# Patient Record
Sex: Male | Born: 1995 | Race: White | Hispanic: No | Marital: Single | Smoking: Current every day smoker
Health system: Southern US, Community
[De-identification: ages and names within clinical notes are randomized; demographics above are authoritative.]

---
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
## 2013-01-18 ENCOUNTER — Encounter (HOSPITAL_BASED_OUTPATIENT_CLINIC_OR_DEPARTMENT_OTHER): Payer: Self-pay | Admitting: *Deleted

## 2013-01-18 ENCOUNTER — Emergency Department (HOSPITAL_BASED_OUTPATIENT_CLINIC_OR_DEPARTMENT_OTHER)
Admission: EM | Admit: 2013-01-18 | Discharge: 2013-01-18 | Disposition: A | Discharge status: Home / Self Care | Payer: Self-pay | Attending: Emergency Medicine | Admitting: Emergency Medicine

## 2013-01-18 DIAGNOSIS — F17200 Nicotine dependence, unspecified, uncomplicated: Secondary | ICD-10-CM | POA: Insufficient documentation

## 2013-01-18 DIAGNOSIS — J029 Acute pharyngitis, unspecified: Secondary | ICD-10-CM | POA: Insufficient documentation

## 2013-01-18 DIAGNOSIS — R509 Fever, unspecified: Secondary | ICD-10-CM | POA: Insufficient documentation

## 2013-01-18 LAB — RAPID STREP SCREEN (MED CTR MEBANE ONLY): Streptococcus, Group A Screen (Direct): NEGATIVE

## 2013-01-18 MED ORDER — HYDROCODONE-ACETAMINOPHEN 5-325 MG PO TABS: 2.0000 | ORAL_TABLET | ORAL | Status: AC | PRN

## 2013-01-18 MED ORDER — PENICILLIN V POTASSIUM 500 MG PO TABS: 500.0000 mg | ORAL_TABLET | Freq: Four times a day (QID) | ORAL | Status: AC

## 2013-01-18 NOTE — ED Provider Notes (Signed)
   History    CSN: [id number] Arrival date & time 01/18/13  1230  First MD Initiated Contact with Patient 01/18/13 1233     Chief Complaint  Patient presents with  . Sore Throat   (Consider location/radiation/quality/duration/timing/severity/associated sxs/prior Treatment) Patient is a 17 y.o. male presenting with pharyngitis. The history is provided by the patient. No language interpreter was used.  Sore Throat This is a new problem. The current episode started today. The problem occurs constantly. The problem has been gradually worsening. Associated symptoms include a fever and a sore throat. Nothing aggravates the symptoms. He has tried nothing for the symptoms. The treatment provided moderate relief.   History reviewed. No pertinent past medical history. History reviewed. No pertinent past surgical history. No family history on file. History  Substance Use Topics  . Smoking status: Current Every Day Smoker -- 0.50 packs/day    Types: Cigarettes  . Smokeless tobacco: Not on file  . Alcohol Use: No    Review of Systems  Constitutional: Positive for fever.  HENT: Positive for sore throat.   All other systems reviewed and are negative.    Allergies  Review of patient's allergies indicates no known allergies.  Home Medications   Current Outpatient Rx  Name  Route  Sig  Dispense  Refill  . acetaminophen (TYLENOL) 500 MG tablet   Oral   Take 500 mg by mouth every 6 (six) hours as needed. For pain.         Marland Kitchen HYDROcodone-acetaminophen (NORCO/VICODIN) 5-325 MG per tablet   Oral   Take 1 tablet by mouth every 4 (four) hours as needed for pain.   15 tablet   0   . hydrOXYzine (ATARAX/VISTARIL) 10 MG tablet   Oral   Take 1 tablet (10 mg total) by mouth every 6 (six) hours as needed for itching.   30 tablet   2   . ibuprofen (ADVIL,MOTRIN) 200 MG tablet   Oral   Take 200 mg by mouth every 6 (six) hours as needed. For headache.         . meloxicam (MOBIC) 7.5 MG  tablet      1 po bid with food   12 tablet   0    BP 141/89  Pulse 72  Temp(Src) 98.7 F (37.1 C) (Oral)  Resp 20  Ht 6\' 1"  (1.854 m)  Wt 322 lb (146.058 kg)  BMI 42.49 kg/m2  SpO2 100% Physical Exam  Nursing note and vitals reviewed. Constitutional: He appears well-developed and well-nourished.  HENT:  Head: Normocephalic.  Right Ear: External ear normal.  Left Ear: External ear normal.  Nose: Nose normal.  Mouth/Throat: Oropharyngeal exudate present.  Eyes: Conjunctivae and EOM are normal. Pupils are equal, round, and reactive to light.  Neck: Normal range of motion. Neck supple.  Cardiovascular: Normal rate.   Pulmonary/Chest: Effort normal.  Abdominal: Soft.  Musculoskeletal: Normal range of motion.  Neurological: He is alert.  Skin: Skin is warm.  Psychiatric: He has a normal mood and affect.    ED Course  Procedures (including critical care time) Labs Reviewed  RAPID STREP SCREEN   No results found. 1. Pharyngitis     MDM  pcnvk 500mg  qid.   See your Physicain next week for recheck if symptoms persist  Elson Areas, [place] 01/18/13 1333

## 2013-01-18 NOTE — ED Notes (Signed)
Sore throat x 1 week. Headache, diarrhea, and random nose bleeds.

## 2013-01-18 NOTE — ED Provider Notes (Signed)
Medical screening examination/treatment/procedure(s) were performed by non-physician practitioner and as supervising physician I was immediately available for consultation/collaboration.   Windsor Zirkelbach J. Kiyoko Mcguirt, MD 01/18/13 1413 

## 2013-01-20 LAB — CULTURE, GROUP A STREP

## 2013-01-29 IMAGING — CR DG FOOT COMPLETE 3+V*R*
3 series · 3 of 3 positions shown · non-contrast
Comparison: None.

CLINICAL DATA: Ankle pain

RIGHT FOOT COMPLETE - 3+ VIEW

[t foot lat right]
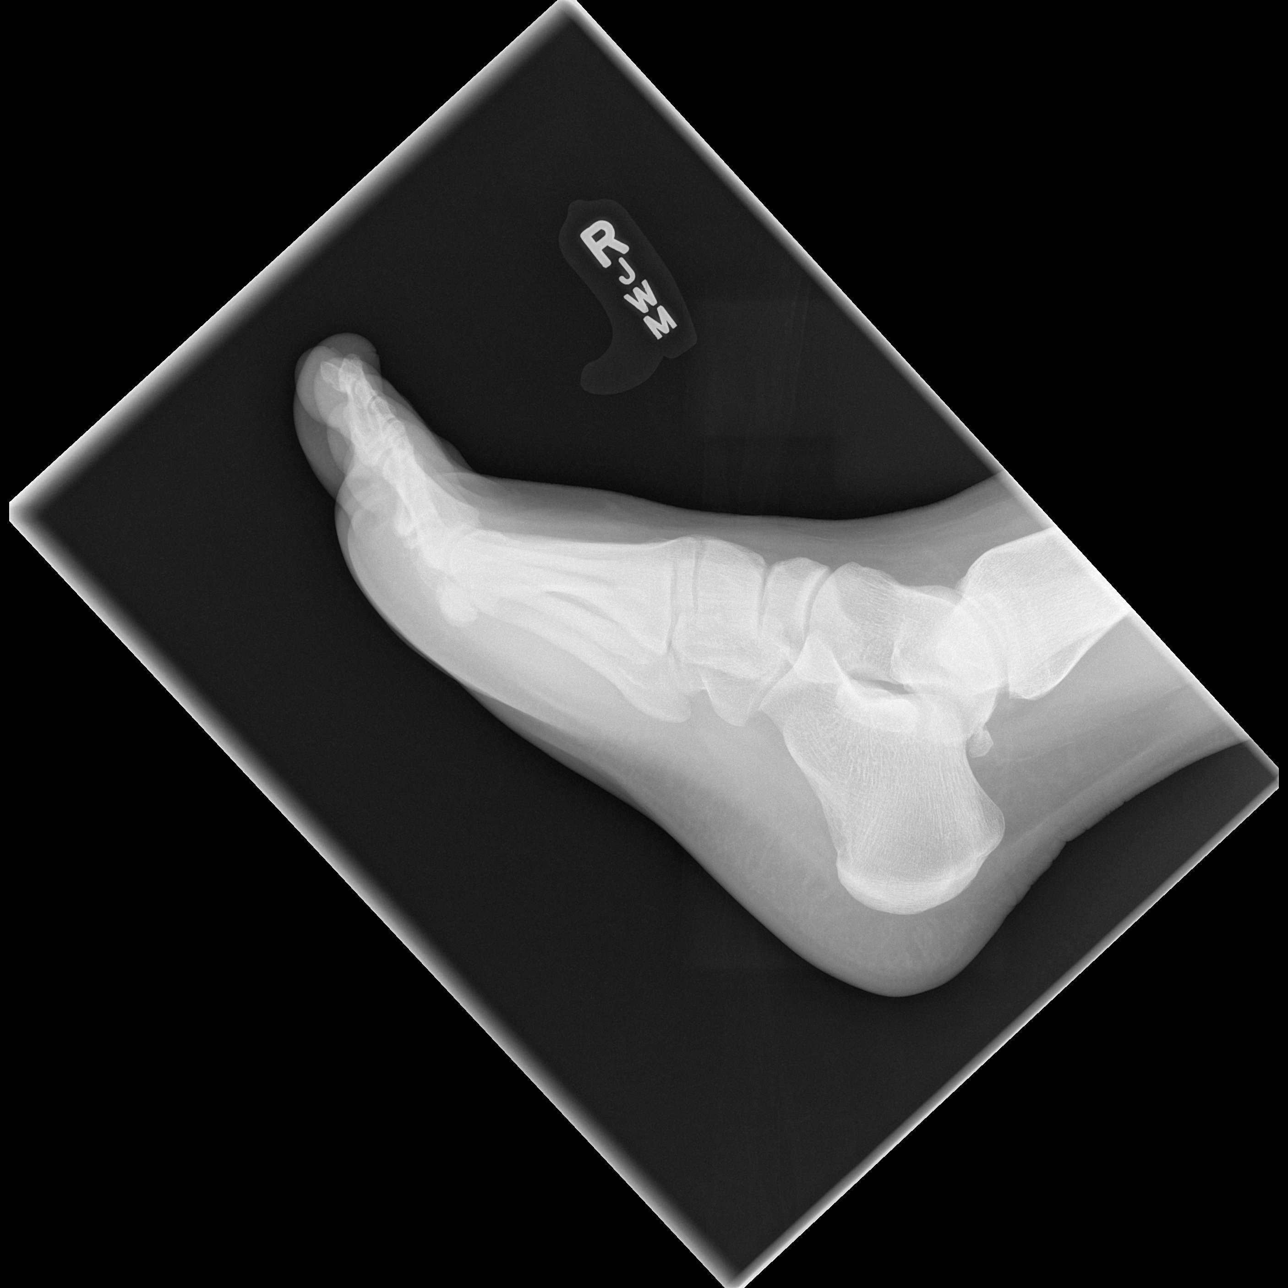

[t foot ap right]
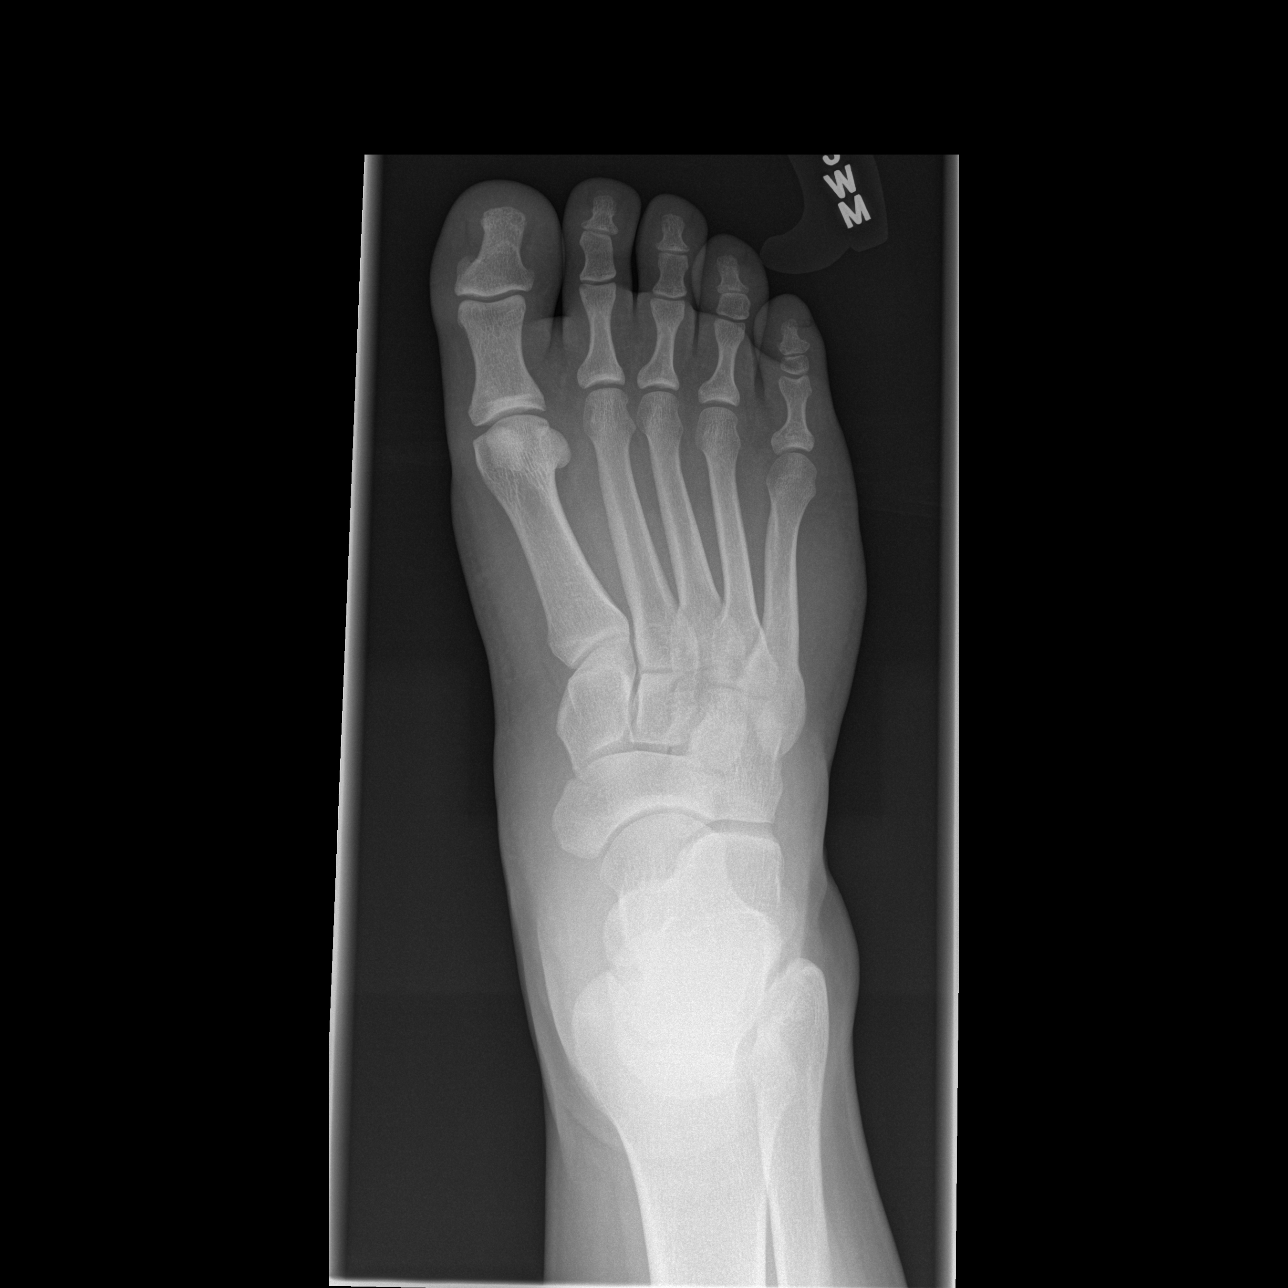

[t foot oblique right]
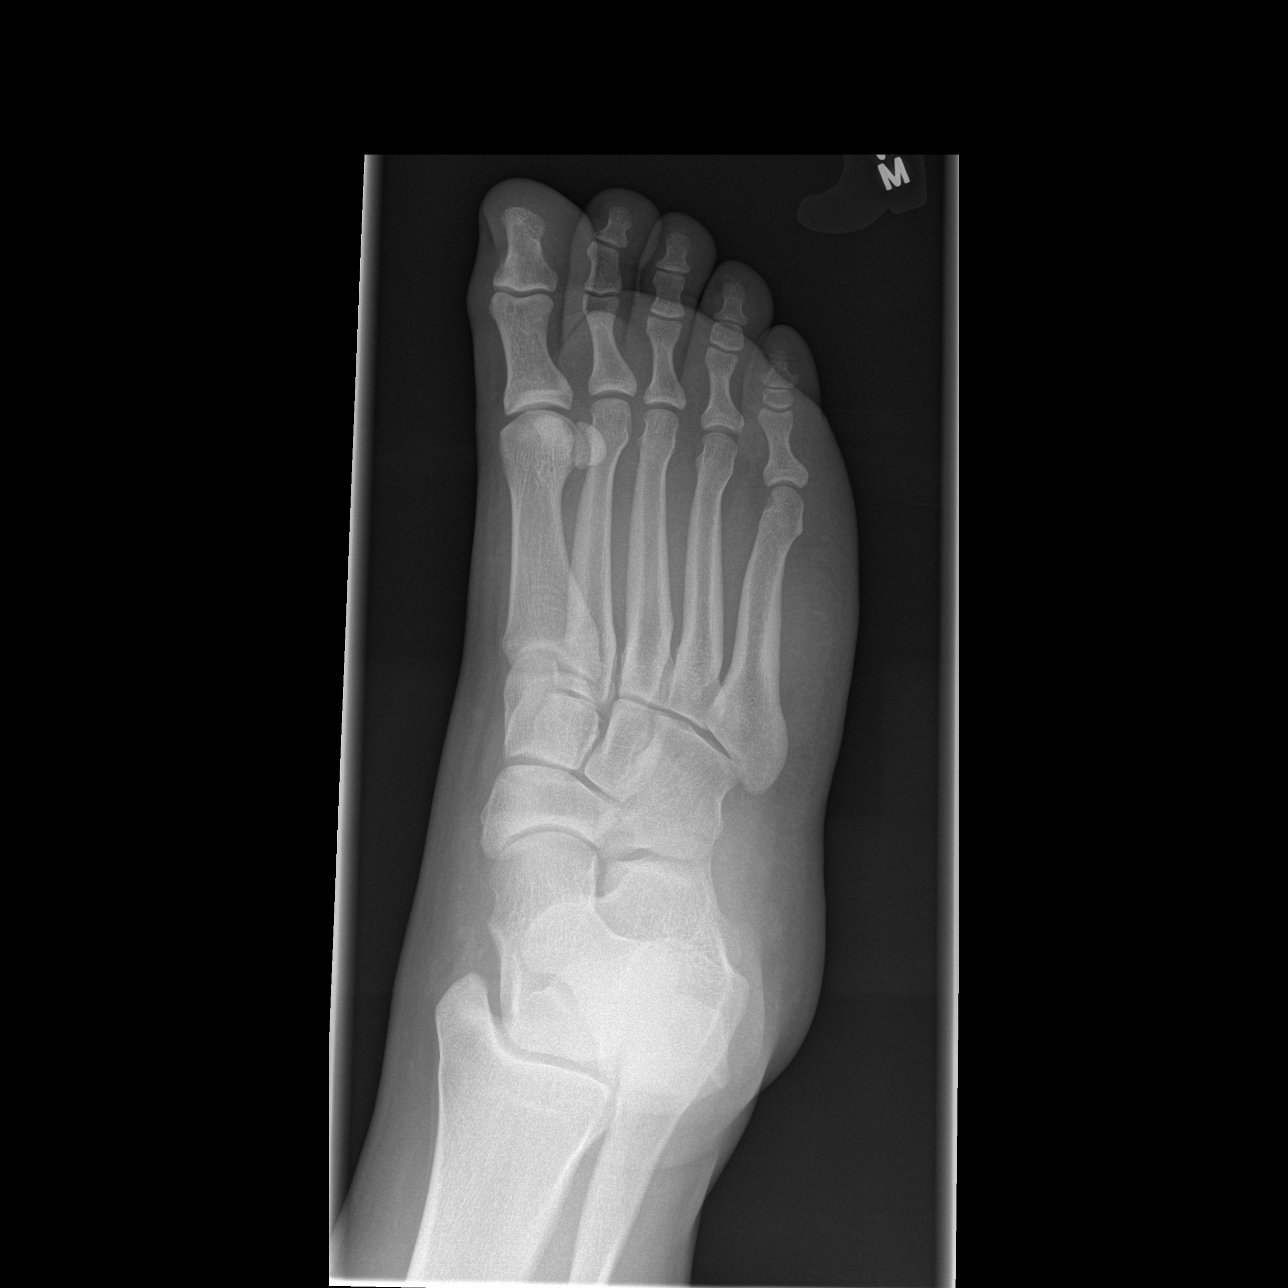

[3 of 3 positions shown; findings below may reference images not displayed]

FINDINGS: Three views of the right foot submitted.  No acute
fracture or subluxation.  No radiopaque foreign body.
IMPRESSION: No acute fracture or subluxation.

## 2013-01-29 IMAGING — CR DG ANKLE COMPLETE 3+V*R*
3 series · 3 of 3 positions shown · non-contrast
Comparison: None

CLINICAL DATA: Ankle pain

RIGHT ANKLE - COMPLETE 3+ VIEW

[t ankle joint ap right]
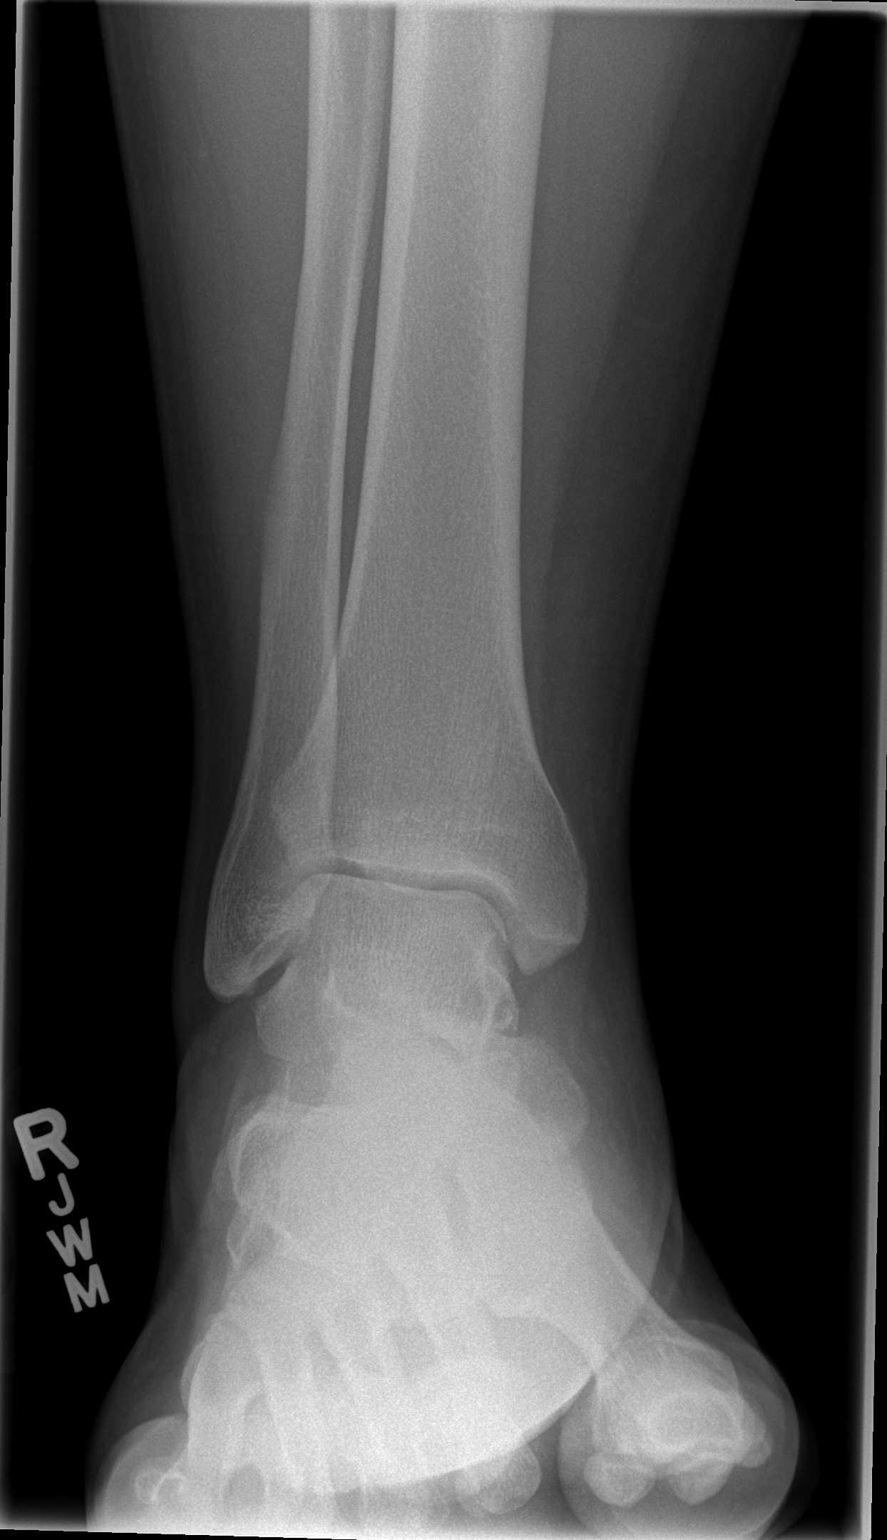

[t ankle joint oblique right]
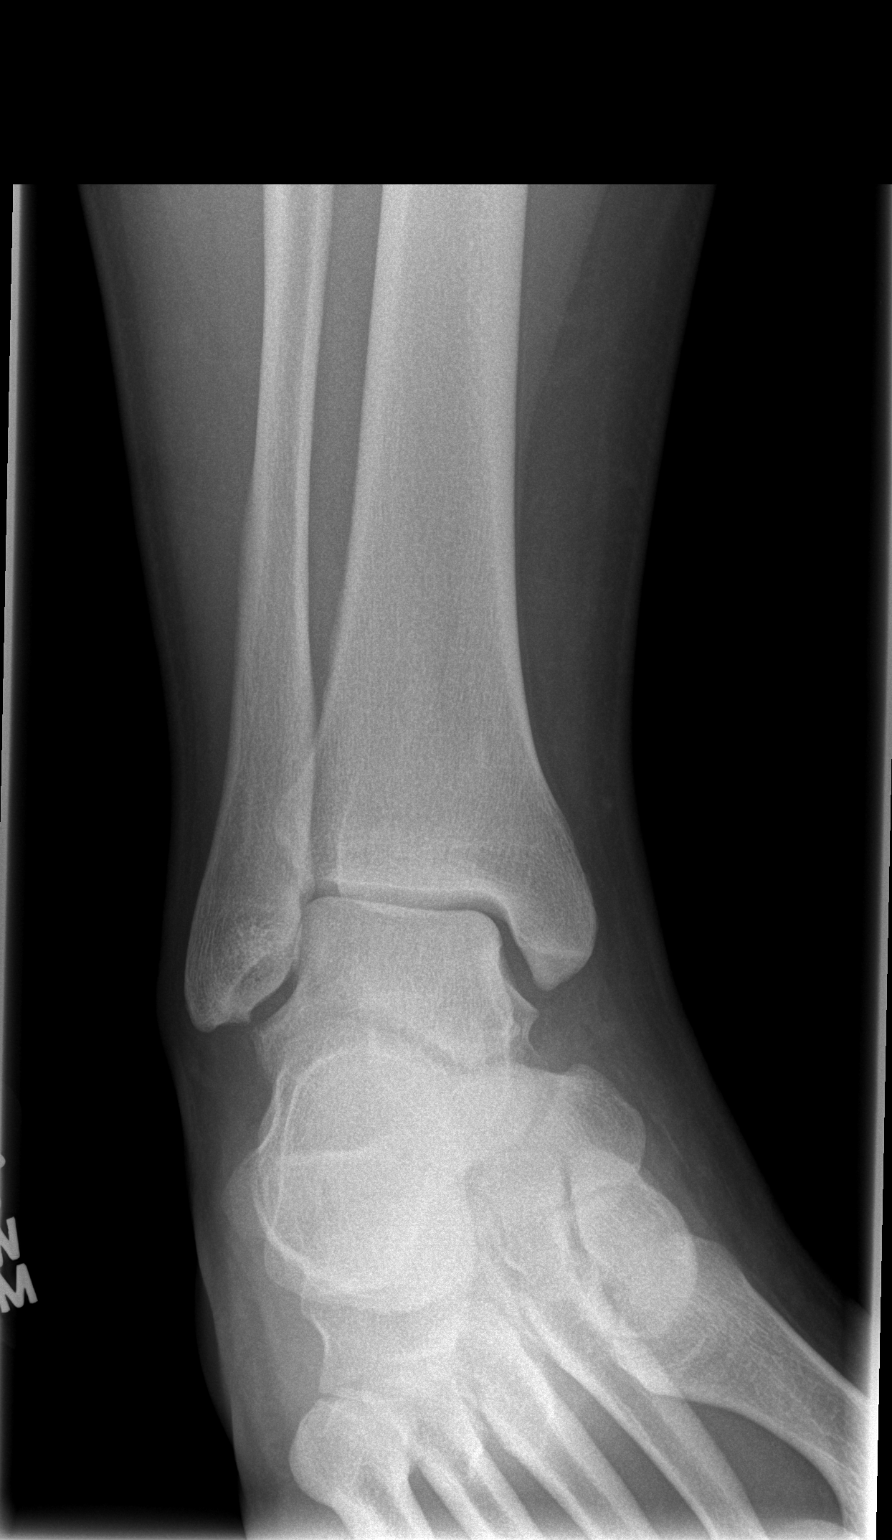

[t ankle joint lat right]
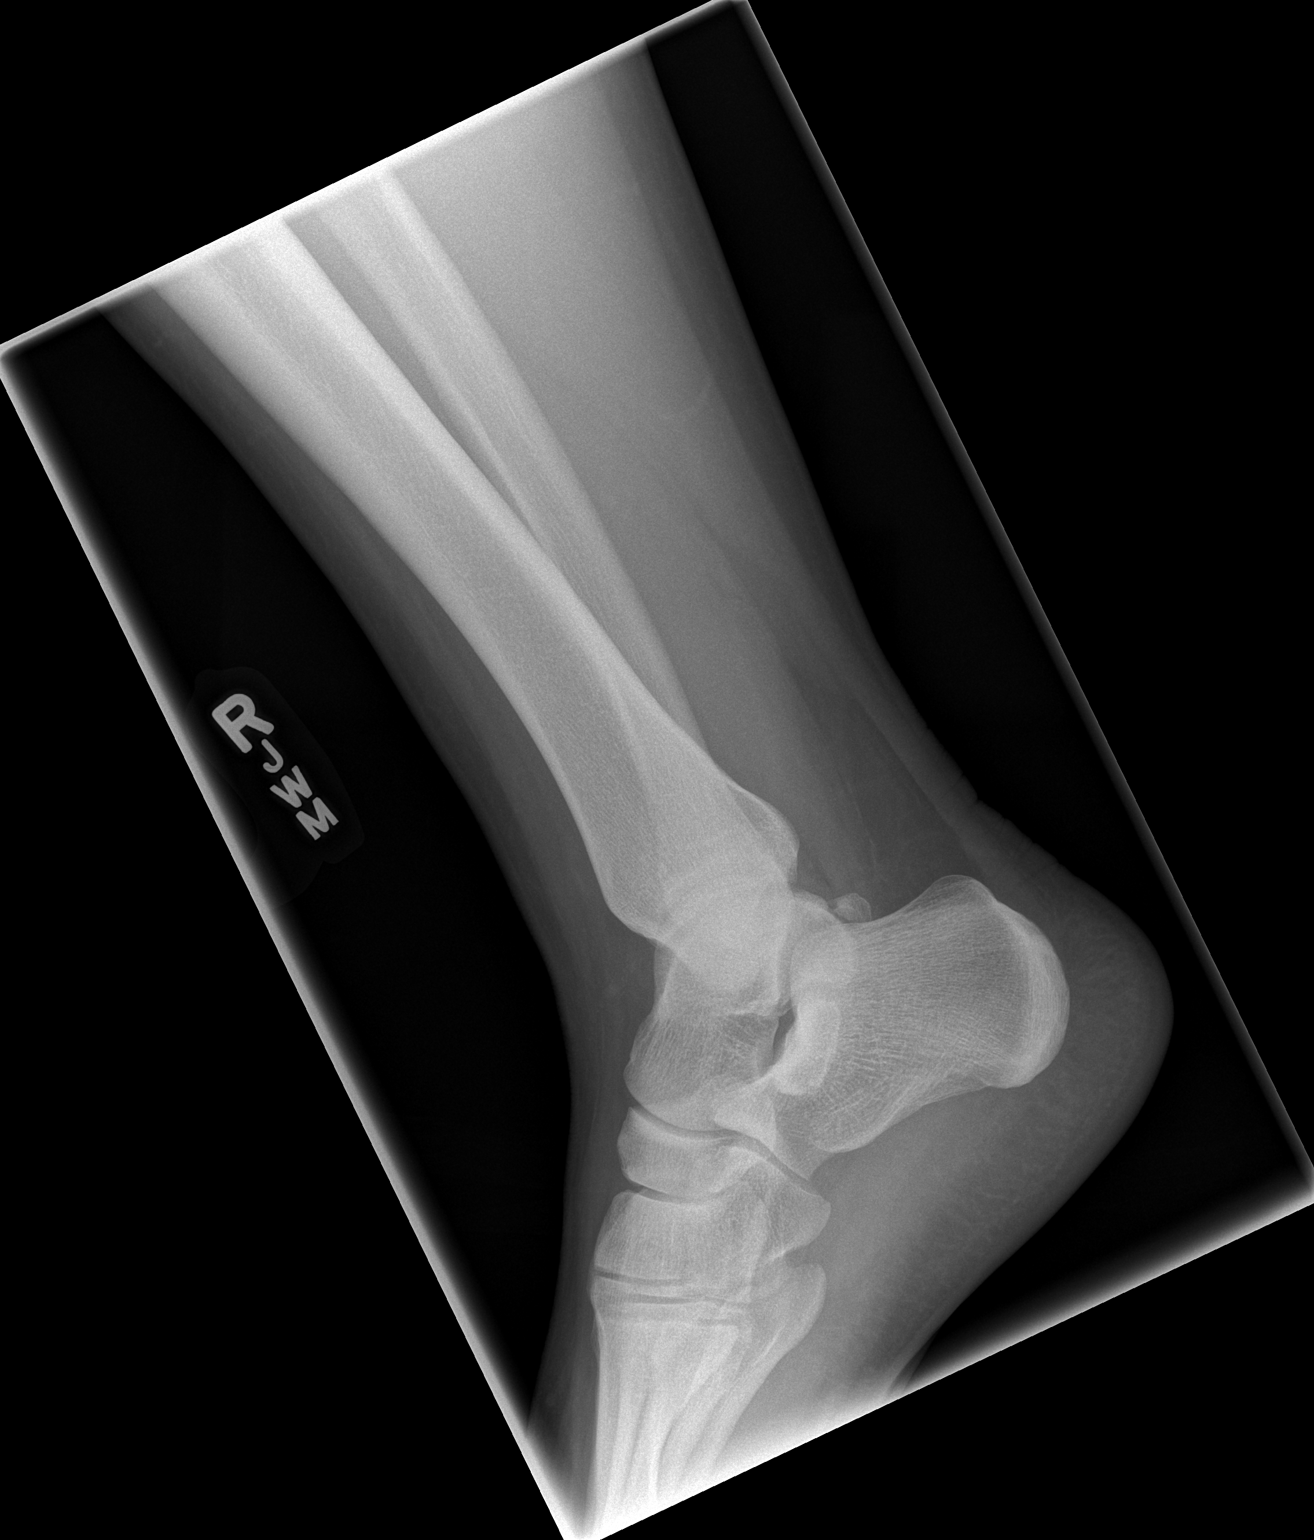

[3 of 3 positions shown; findings below may reference images not displayed]

FINDINGS: Three views of the right ankle submitted.  No acute
fracture or subluxation.  Ankle mortise is preserved.
IMPRESSION: No acute fracture or subluxation.

## 2018-09-19 ENCOUNTER — Other Ambulatory Visit: Payer: Self-pay

## 2018-09-19 ENCOUNTER — Encounter (HOSPITAL_COMMUNITY): Payer: Self-pay

## 2018-09-19 ENCOUNTER — Emergency Department (HOSPITAL_COMMUNITY)
Admission: EM | Admit: 2018-09-19 | Discharge: 2018-09-19 | Disposition: A | Discharge status: Home / Self Care | Payer: No Typology Code available for payment source | Attending: Emergency Medicine | Admitting: Emergency Medicine

## 2018-09-19 DIAGNOSIS — J029 Acute pharyngitis, unspecified: Secondary | ICD-10-CM | POA: Diagnosis not present

## 2018-09-19 DIAGNOSIS — J111 Influenza due to unidentified influenza virus with other respiratory manifestations: Secondary | ICD-10-CM | POA: Diagnosis not present

## 2018-09-19 DIAGNOSIS — R509 Fever, unspecified: Secondary | ICD-10-CM | POA: Diagnosis present

## 2018-09-19 DIAGNOSIS — Z20828 Contact with and (suspected) exposure to other viral communicable diseases: Secondary | ICD-10-CM | POA: Diagnosis not present

## 2018-09-19 DIAGNOSIS — F17210 Nicotine dependence, cigarettes, uncomplicated: Secondary | ICD-10-CM | POA: Diagnosis not present

## 2018-09-19 DIAGNOSIS — R69 Illness, unspecified: Secondary | ICD-10-CM

## 2018-09-19 LAB — INFLUENZA PANEL BY PCR (TYPE A & B)
INFLAPCR: NEGATIVE
INFLBPCR: NEGATIVE

## 2018-09-19 MED ORDER — IBUPROFEN 800 MG PO TABS
800.0000 mg | ORAL_TABLET | Freq: Once | ORAL | Status: AC
  Administered 2018-09-19: 800 mg via ORAL
  Filled 2018-09-19: qty 1

## 2018-09-19 NOTE — Discharge Instructions (Signed)
Person Under Monitoring Name: Corey Benitez  Location: [address] [place] [address]   Infection Prevention Recommendations for Individuals Confirmed to have, or Being Evaluated for, 2019 Novel Coronavirus (COVID-19) Infection Who Receive Care at Home  Individuals who are confirmed to have, or are being evaluated for, COVID-19 should follow the prevention steps below until a healthcare provider or local or state health department says they can return to normal activities.  Stay home except to get medical care You should restrict activities outside your home, except for getting medical care. Do not go to work, school, or public areas, and do not use public transportation or taxis.  Call ahead before visiting your doctor Before your medical appointment, call the healthcare provider and tell them that you have, or are being evaluated for, COVID-19 infection. This will help the healthcare providers office take steps to keep other people from getting infected. Ask your healthcare provider to call the local or state health department.  Monitor your symptoms Seek prompt medical attention if your illness is worsening (e.g., difficulty breathing). Before going to your medical appointment, call the healthcare provider and tell them that you have, or are being evaluated for, COVID-19 infection. Ask your healthcare provider to call the local or state health department.  Wear a facemask You should wear a facemask that covers your nose and mouth when you are in the same room with other people and when you visit a healthcare provider. People who live with or visit you should also wear a facemask while they are in the same room with you.  Separate yourself from other people in your home As much as possible, you should stay in a different room from other people in your home. Also, you should use a separate bathroom, if available.  Avoid sharing household items You should not  share dishes, drinking glasses, cups, eating utensils, towels, bedding, or other items with other people in your home. After using these items, you should wash them thoroughly with soap and water.  Cover your coughs and sneezes Cover your mouth and nose with a tissue when you cough or sneeze, or you can cough or sneeze into your sleeve. Throw used tissues in a lined trash can, and immediately wash your hands with soap and water for at least 20 seconds or use an alcohol-based hand rub.  Wash your [employer] your hands often and thoroughly with soap and water for at least 20 seconds. You can use an alcohol-based hand sanitizer if soap and water are not available and if your hands are not visibly dirty. Avoid touching your eyes, nose, and mouth with unwashed hands.   Prevention Steps for Caregivers and Household Members of Individuals Confirmed to have, or Being Evaluated for, COVID-19 Infection Being Cared for in the Home  If you live with, or provide care at home for, a person confirmed to have, or being evaluated for, COVID-19 infection please follow these guidelines to prevent infection:  Follow healthcare providers instructions Make sure that you understand and can help the patient follow any healthcare provider instructions for all care.  Provide for the patients basic needs You should help the patient with basic needs in the home and provide support for getting groceries, prescriptions, and other personal needs.  Monitor the patients symptoms If they are getting sicker, call his or her medical provider and tell them that the patient has, or is being evaluated for, COVID-19 infection. This will help the healthcare providers office  take steps to keep other people from getting infected. Ask the healthcare provider to call the local or state health department.  Limit the number of people who have contact with the patient If possible, have only one caregiver for the  patient. Other household members should stay in another home or place of residence. If this is not possible, they should stay in another room, or be separated from the patient as much as possible. Use a separate bathroom, if available. Restrict visitors who do not have an essential need to be in the home.  Keep older adults, very young children, and other sick people away from the patient Keep older adults, very young children, and those who have compromised immune systems or chronic health conditions away from the patient. This includes people with chronic heart, lung, or kidney conditions, diabetes, and cancer.  Ensure good ventilation Make sure that shared spaces in the home have good air flow, such as from an air conditioner or an opened window, weather permitting.  Wash your hands often Wash your hands often and thoroughly with soap and water for at least 20 seconds. You can use an alcohol based hand sanitizer if soap and water are not available and if your hands are not visibly dirty. Avoid touching your eyes, nose, and mouth with unwashed hands. Use disposable paper towels to dry your hands. If not available, use dedicated cloth towels and replace them when they become wet.  Wear a facemask and gloves Wear a disposable facemask at all times in the room and gloves when you touch or have contact with the patients blood, body fluids, and/or secretions or excretions, such as sweat, saliva, sputum, nasal mucus, vomit, urine, or feces.  Ensure the mask fits over your nose and mouth tightly, and do not touch it during use. Throw out disposable facemasks and gloves after using them. Do not reuse. Wash your hands immediately after removing your facemask and gloves. If your personal clothing becomes contaminated, carefully remove clothing and launder. Wash your hands after handling contaminated clothing. Place all used disposable facemasks, gloves, and other waste in a lined container before  disposing them with other household waste. Remove gloves and wash your hands immediately after handling these items.  Do not share dishes, glasses, or other household items with the patient Avoid sharing household items. You should not share dishes, drinking glasses, cups, eating utensils, towels, bedding, or other items with a patient who is confirmed to have, or being evaluated for, COVID-19 infection. After the person uses these items, you should wash them thoroughly with soap and water.  Wash laundry thoroughly Immediately remove and wash clothes or bedding that have blood, body fluids, and/or secretions or excretions, such as sweat, saliva, sputum, nasal mucus, vomit, urine, or feces, on them. Wear gloves when handling laundry from the patient. Read and follow directions on labels of laundry or clothing items and detergent. In general, wash and dry with the warmest temperatures recommended on the label.  Clean all areas the individual has used often Clean all touchable surfaces, such as counters, tabletops, doorknobs, bathroom fixtures, toilets, phones, keyboards, tablets, and bedside tables, every day. Also, clean any surfaces that may have blood, body fluids, and/or secretions or excretions on them. Wear gloves when cleaning surfaces the patient has come in contact with. Use a diluted bleach solution (e.g., dilute bleach with 1 part bleach and 10 parts water) or a household disinfectant with a label that says EPA-registered for coronaviruses. To make a bleach  solution at home, add 1 tablespoon of bleach to 1 quart (4 cups) of water. For a larger supply, add  cup of bleach to 1 gallon (16 cups) of water. Read labels of cleaning products and follow recommendations provided on product labels. Labels contain instructions for safe and effective use of the cleaning product including precautions you should take when applying the product, such as wearing gloves or eye protection and making sure you  have good ventilation during use of the product. Remove gloves and wash hands immediately after cleaning.  Monitor yourself for signs and symptoms of illness Caregivers and household members are considered close contacts, should monitor their health, and will be asked to limit movement outside of the home to the extent possible. Follow the monitoring steps for close contacts listed on the symptom monitoring form.   ? If you have additional questions, contact your local health department or call the epidemiologist on call at [phone] (available 24/7). ? This guidance is subject to change. For the most up-to-date guidance from [facility], please refer to their website: [email or web address]

## 2018-09-19 NOTE — ED Triage Notes (Signed)
Pt reports fever, sore throat and non productive cough since this morning. Took mucinex around 5pm.  Recent travel to [place] by car, states his friends grandpa was recently tested positive for COVID 19. Pt a.o, resp e.u, nad noted

## 2018-09-19 NOTE — ED Provider Notes (Signed)
[facility] EMERGENCY DEPARTMENT Provider Note   CSN: [id number] Arrival date & time: 09/19/18  2334    History   Chief Complaint Chief Complaint  Patient presents with  . Sore Throat  . Cough  . Fever    HPI Corey Benitez is a 23 y.o. male.  Who presents with influenza-like illness.  Patient's had sudden onset of fevers, chills, cough, myalgias and mild sore throat beginning today.  Who lives in [facility] in [place].  He states that he has been hanging around with a friend with the same symptoms and who recently found out that his grandfather tested positive for corona virus.  Denies wheezing or shortness of breath.  He is a daily smoker.  He has no other chronic medical conditions.       HPI  History reviewed. No pertinent past medical history.  There are no active problems to display for this patient.   History reviewed. No pertinent surgical history.      Home Medications    Prior to Admission medications   Medication Sig Start Date End Date Taking? Authorizing Provider  acetaminophen (TYLENOL) 500 MG tablet Take 500 mg by mouth every 6 (six) hours as needed. For pain.    [provider]  HYDROcodone-acetaminophen (NORCO/VICODIN) 5-325 MG per tablet Take 1 tablet by mouth every 4 (four) hours as needed for pain. 04/24/12   Ivery Quale, PA-C  HYDROcodone-acetaminophen (NORCO/VICODIN) 5-325 MG per tablet Take 2 tablets by mouth every 4 (four) hours as needed. 01/18/13   Elson Areas, PA-C  hydrOXYzine (ATARAX/VISTARIL) 10 MG tablet Take 1 tablet (10 mg total) by mouth every 6 (six) hours as needed for itching. 04/04/12   Oliveri, Tia L, PA-C  ibuprofen (ADVIL,MOTRIN) 200 MG tablet Take 200 mg by mouth every 6 (six) hours as needed. For headache.    [provider]  meloxicam (MOBIC) 7.5 MG tablet 1 po bid with food 04/24/12   Ivery Quale, PA-C    Family History No family history on file.  Social  History Social History   Tobacco Use  . Smoking status: Current Every Day Smoker    Packs/day: 0.50    Types: Cigarettes  Substance Use Topics  . Alcohol use: No  . Drug use: No     Allergies   Patient has no known allergies.   Review of Systems Review of Systems Ten systems reviewed and are negative for acute change, except as noted in the HPI.    Physical Exam Updated Vital Signs BP 127/78   Pulse 99   Temp (!) 101.4 F (38.6 C) (Oral)   Resp 16   Ht 6\' 1"  (1.854 m)   Wt 133.8 kg   SpO2 100%   BMI 38.92 kg/m   Physical Exam Vitals signs and nursing note reviewed.  Constitutional:      General: He is not in acute distress.    Appearance: He is well-developed. He is ill-appearing. He is not diaphoretic.  HENT:     Head: Normocephalic and atraumatic.  Eyes:     General: No scleral icterus.    Conjunctiva/sclera: Conjunctivae normal.  Neck:     Musculoskeletal: Normal range of motion and neck supple.  Cardiovascular:     Rate and Rhythm: Normal rate and regular rhythm.     Heart sounds: Normal heart sounds.  Pulmonary:     Effort: Pulmonary effort is normal. No respiratory distress.     Breath sounds: Normal  breath sounds.  Abdominal:     Palpations: Abdomen is soft.     Tenderness: There is no abdominal tenderness.  Skin:    General: Skin is warm and dry.  Neurological:     Mental Status: He is alert.  Psychiatric:        Behavior: Behavior normal.      ED Treatments / Results  Labs (all labs ordered are listed, but only abnormal results are displayed) Labs Reviewed  NOVEL CORONAVIRUS, NAA (HOSPITAL ORDER, SEND-OUT TO REF LAB)  INFLUENZA PANEL BY PCR (TYPE A & B)    EKG None  Radiology No results found.  Procedures Procedures (including critical care time)  Medications Ordered in ED Medications  ibuprofen (ADVIL,MOTRIN) tablet 800 mg (800 mg Oral Given 09/19/18 2013)     Initial Impression / Assessment and Plan / ED Course  I  have reviewed the triage vital signs and the nursing notes.  Pertinent labs & imaging results that were available during my care of the patient were reviewed by me and considered in my medical decision making (see chart for details).        I discussed the case with Dr. Daiva Eves he states that given his degree of contract he should be tested.  Have also tested for influenza.  Patient will be discharged as a person under investigation.  I have filled out the appropriate paperwork and patient has signed his agreement to remain under quarantine.  He is concerned because he lives with his 66 year old grandmother and does not have another place to go or stay.  The patient is advised to remain under strict contact precautions from his grandmother as much as possible in the home.  I have given him discharge paperwork for home quarantine.  Patient also given a prescription for Xofluza.  His influenza is currently pending.  He appears appropriate for discharge at this time.  Discussed return precautions.  Final Clinical Impressions(s) / ED Diagnoses   Final diagnoses:  Influenza-like illness    ED Discharge Orders    None       Arthor Captain, PA-C 09/19/18 2037    Wynetta Fines, MD 09/24/18 [phone]

## 2018-09-25 ENCOUNTER — Telehealth (HOSPITAL_COMMUNITY): Payer: Self-pay

## 2018-09-26 LAB — NOVEL CORONAVIRUS, NAA (HOSPITAL ORDER, SEND-OUT TO REF LAB): SARS-COV-2, NAA: NOT DETECTED

## 2023-04-03 ENCOUNTER — Emergency Department (HOSPITAL_COMMUNITY)
Admission: EM | Admit: 2023-04-03 | Discharge: 2023-04-04 | Disposition: A | Discharge status: Home / Self Care | Payer: No Typology Code available for payment source | Attending: Emergency Medicine | Admitting: Emergency Medicine

## 2023-04-03 ENCOUNTER — Other Ambulatory Visit: Payer: Self-pay

## 2023-04-03 ENCOUNTER — Encounter (HOSPITAL_COMMUNITY): Payer: Self-pay | Admitting: Emergency Medicine

## 2023-04-03 DIAGNOSIS — E669 Obesity, unspecified: Secondary | ICD-10-CM | POA: Insufficient documentation

## 2023-04-03 DIAGNOSIS — Z6838 Body mass index (BMI) 38.0-38.9, adult: Secondary | ICD-10-CM | POA: Insufficient documentation

## 2023-04-03 DIAGNOSIS — L03113 Cellulitis of right upper limb: Secondary | ICD-10-CM

## 2023-04-03 NOTE — ED Triage Notes (Signed)
Wound to R wrist that has become more red and swollen x 2 days. Noted draining thin, yellow fluid.  Pt has a kitten and thinks may have started as scratch.  Last tetanus 2021.  No daily meds

## 2023-04-04 MED ORDER — DOXYCYCLINE HYCLATE 100 MG PO TABS
100.0000 mg | ORAL_TABLET | Freq: Once | ORAL | Status: AC
  Administered 2023-04-04: 100 mg via ORAL
  Filled 2023-04-04: qty 1

## 2023-04-04 MED ORDER — DOXYCYCLINE HYCLATE 100 MG PO CAPS: 100.0000 mg | ORAL_CAPSULE | Freq: Two times a day (BID) | ORAL | 0 refills | Status: AC

## 2023-04-04 NOTE — ED Provider Notes (Signed)
[place] EMERGENCY DEPARTMENT AT [facility] Provider Note   CSN: [id number] Arrival date & time: 04/03/23  2132     History  Chief Complaint  Patient presents with   Wound Infection    Corey Benitez is a 27 y.o. male.  HPI     This is a 27 year old male who presents with concern for redness and swelling to the right wrist.  Patient is unsure whether he sustained a cat scratch to the wrist.  He has noted some drainage.  He is also noted some redness that has began to expand.  He reports some tenderness with wrist extension.  He is right-handed.  He has not had any fevers or systemic symptoms.  Home Medications Prior to Admission medications   Medication Sig Start Date End Date Taking? Authorizing Provider  doxycycline (VIBRAMYCIN) 100 MG capsule Take 1 capsule (100 mg total) by mouth 2 (two) times daily. 04/04/23  Yes Justin Buechner, Mayer Masker, MD  acetaminophen (TYLENOL) 500 MG tablet Take 500 mg by mouth every 6 (six) hours as needed. For pain.    [provider]  HYDROcodone-acetaminophen (NORCO/VICODIN) 5-325 MG per tablet Take 1 tablet by mouth every 4 (four) hours as needed for pain. 04/24/12   Ivery Quale, PA-C  HYDROcodone-acetaminophen (NORCO/VICODIN) 5-325 MG per tablet Take 2 tablets by mouth every 4 (four) hours as needed. 01/18/13   Elson Areas, PA-C  hydrOXYzine (ATARAX/VISTARIL) 10 MG tablet Take 1 tablet (10 mg total) by mouth every 6 (six) hours as needed for itching. 04/04/12   Oliveri, Tia L, PA-C  ibuprofen (ADVIL,MOTRIN) 200 MG tablet Take 200 mg by mouth every 6 (six) hours as needed. For headache.    [provider]  meloxicam (MOBIC) 7.5 MG tablet 1 po bid with food 04/24/12   Ivery Quale, PA-C      Allergies    Patient has no known allergies.    Review of Systems   Review of Systems  Constitutional:  Negative for fever.  Skin:  Positive for color change.  All other systems reviewed and are negative.   Physical  Exam Updated Vital Signs BP 134/84 (BP Location: Right Arm)   Pulse 65   Temp 97.9 F (36.6 C) (Oral)   Resp 18   Wt 133 kg   SpO2 100%   BMI 38.68 kg/m  Physical Exam Vitals and nursing note reviewed.  Constitutional:      Appearance: He is well-developed. He is obese. He is not ill-appearing.  HENT:     Head: Normocephalic and atraumatic.  Eyes:     Pupils: Pupils are equal, round, and reactive to light.  Cardiovascular:     Rate and Rhythm: Normal rate and regular rhythm.  Pulmonary:     Effort: Pulmonary effort is normal. No respiratory distress.  Abdominal:     Palpations: Abdomen is soft.  Musculoskeletal:     Cervical back: Neck supple.     Comments: Normal range of motion right wrist  Lymphadenopathy:     Cervical: No cervical adenopathy.  Skin:    General: Skin is warm and dry.     Comments: Redness and erythema noted over the dorsum of the right wrist, there is a punctate scabbed area without spontaneous drainage, no fluctuance or induration  Neurological:     Mental Status: He is alert and oriented to person, place, and time.  Psychiatric:        Mood and Affect: Mood normal.  ED Results / Procedures / Treatments   Labs (all labs ordered are listed, but only abnormal results are displayed) Labs Reviewed - No data to display  EKG None  Radiology No results found.  Procedures Procedures    Medications Ordered in ED Medications  doxycycline (VIBRA-TABS) tablet 100 mg (has no administration in time range)    ED Course/ Medical Decision Making/ A&P                                 Medical Decision Making Risk Prescription drug management.   This patient presents to the ED for concern of right wrist redness, this involves an extensive number of treatment options, and is a complaint that carries with it a high risk of complications and morbidity.  I considered the following differential and admission for this acute, potentially life  threatening condition.  The differential diagnosis includes abscess, cellulitis, cat scratch disease  MDM:    This is a 27 year old male who presents with redness to the right wrist.  He is nontoxic and vital signs are reassuring.  No drainable abscess at this time.  At minimum he has cellulitis.  Feel this is more consistent with MRSA than cat scratch disease which is usually a self-limited entity.  Will start on doxycycline.  He was given return precautions.  (Labs, imaging, consults)  Labs: I Ordered, and personally interpreted labs.  The pertinent results include: None  Imaging Studies ordered: I ordered imaging studies including none I independently visualized and interpreted imaging. I agree with the radiologist interpretation  Additional history obtained from chart review.  External records from outside source obtained and reviewed including prior evaluations  Cardiac Monitoring: The patient was not maintained on a cardiac monitor.  If on the cardiac monitor, I personally viewed and interpreted the cardiac monitored which showed an underlying rhythm of: N/A  Reevaluation: After the interventions noted above, I reevaluated the patient and found that they have :stayed the same  Social Determinants of Health:  lives independently  Disposition: Discharge  Co morbidities that complicate the patient evaluation History reviewed. No pertinent past medical history.   Medicines Meds ordered this encounter  Medications   doxycycline (VIBRA-TABS) tablet 100 mg   doxycycline (VIBRAMYCIN) 100 MG capsule    Sig: Take 1 capsule (100 mg total) by mouth 2 (two) times daily.    Dispense:  20 capsule    Refill:  0    I have reviewed the patients home medicines and have made adjustments as needed  Problem List / ED Course: Problem List Items Addressed This Visit   None Visit Diagnoses     Cellulitis of right upper extremity    -  Primary                   Final  Clinical Impression(s) / ED Diagnoses Final diagnoses:  Cellulitis of right upper extremity    Rx / DC Orders ED Discharge Orders          Ordered    doxycycline (VIBRAMYCIN) 100 MG capsule  2 times daily        04/04/23 0612              Shon Baton, MD 04/04/23 [phone]

## 2023-04-04 NOTE — Discharge Instructions (Addendum)
You were seen today for redness of the right wrist.  This is consistent with cellulitis.  It may be MRSA.  Take antibiotics as prescribed.  Monitor for signs and symptoms of infection including fever.  If you note streaking redness, you should be reevaluated.
